# Patient Record
Sex: Male | Born: 1975 | Race: White | Hispanic: Yes | Marital: Married | Smoking: Never smoker
Health system: Southern US, Community
[De-identification: ages and names within clinical notes are randomized; demographics above are authoritative.]

## PROBLEM LIST (undated history)

## (undated) ENCOUNTER — Emergency Department (HOSPITAL_COMMUNITY): Admission: EM | Payer: Self-pay | Source: Home / Self Care

## (undated) DIAGNOSIS — K649 Unspecified hemorrhoids: Secondary | ICD-10-CM

## (undated) HISTORY — PX: NO PAST SURGERIES: SHX2092

---

## 2004-04-23 ENCOUNTER — Emergency Department (HOSPITAL_COMMUNITY): Admission: EM | Admit: 2004-04-23 | Discharge: 2004-04-23 | Payer: Self-pay | Admitting: Emergency Medicine

## 2012-07-18 ENCOUNTER — Emergency Department (HOSPITAL_COMMUNITY): Payer: Self-pay | Attending: Family Medicine

## 2012-07-18 ENCOUNTER — Encounter (HOSPITAL_COMMUNITY): Payer: Self-pay | Admitting: *Deleted

## 2012-07-18 ENCOUNTER — Ambulatory Visit (INDEPENDENT_AMBULATORY_CARE_PROVIDER_SITE_OTHER): Payer: 59 | Admitting: Family Medicine

## 2012-07-18 ENCOUNTER — Emergency Department (HOSPITAL_COMMUNITY): Payer: Self-pay

## 2012-07-18 VITALS — BP 121/71 | HR 77 | Temp 98.4°F | Resp 16 | Ht 67.5 in | Wt 168.0 lb

## 2012-07-18 DIAGNOSIS — R51 Headache: Secondary | ICD-10-CM

## 2012-07-18 DIAGNOSIS — Y9366 Activity, soccer: Secondary | ICD-10-CM | POA: Insufficient documentation

## 2012-07-18 DIAGNOSIS — W219XXA Striking against or struck by unspecified sports equipment, initial encounter: Secondary | ICD-10-CM | POA: Insufficient documentation

## 2012-07-18 DIAGNOSIS — R110 Nausea: Secondary | ICD-10-CM

## 2012-07-18 DIAGNOSIS — R42 Dizziness and giddiness: Secondary | ICD-10-CM

## 2012-07-18 DIAGNOSIS — S0990XA Unspecified injury of head, initial encounter: Secondary | ICD-10-CM | POA: Insufficient documentation

## 2012-07-18 DIAGNOSIS — S0191XA Laceration without foreign body of unspecified part of head, initial encounter: Secondary | ICD-10-CM

## 2012-07-18 DIAGNOSIS — S0190XA Unspecified open wound of unspecified part of head, initial encounter: Secondary | ICD-10-CM

## 2012-07-18 MED ORDER — CEPHALEXIN 500 MG PO CAPS: 500.0000 mg | ORAL_CAPSULE | Freq: Four times a day (QID) | ORAL | Status: AC

## 2012-07-18 NOTE — Patient Instructions (Addendum)
Go to [facility] er dept register there tell er registration you are here for out pt head ct.  WOUND CARE Please return in 7-10 days to have your stitches/staples removed or sooner if you have concerns. Marland Kitchen Keep area clean and dry for 24 hours. Do not remove bandage, if applied. . After 24 hours, remove bandage and wash wound gently with mild soap and warm water. Reapply a new bandage after cleaning wound, if directed. . Continue daily cleansing with soap and water until stitches/staples are removed. . Do not apply any ointments or creams to the wound while stitches/staples are in place, as this may cause delayed healing. . Notify the office if you experience any of the following signs of infection: Swelling, redness, pus drainage, streaking, fever >101.0 F . Notify the office if you experience excessive bleeding that does not stop after 15-20 minutes of constant, firm pressure.

## 2012-07-18 NOTE — ED Notes (Signed)
Sent to ED for Head CT. Pt was seen at [facility] for lac to head after injury while playing soccer. Pt states he was hit with a knee in the forehead. No loc. No nausea.

## 2012-07-18 NOTE — Progress Notes (Signed)
Verbal consent obtained from the patient.  Local anesthesia with 3cc Lidocaine 2% with epinephrine.  Wound scrubbed with soap and water and rinsed.  Wound closed with #4 6-0 vicryl internally and #5 5-0 Prolene HM and SI sutures.  Wound cleansed and dressed.

## 2012-07-18 NOTE — Progress Notes (Addendum)
[facility]:  Office Visit  Chief Complaint:  Chief Complaint  Patient presents with  . Head Injury    about 2:30p    HPI: John Richmond is a 36 y.o. male who complains of head injury 15-30  minutes ago while playing soccer. He was going for the soccer ball with his head and got kneed by another player to the right forehead, upon impact cause his forehead to be cut open and bleed. + sharp 3-4/10 right sided HA, + nausea, + dizziness that has been with him since injury. No LOC. Denies confusion or being disoriented. 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 Up to date on Tdap  History reviewed. No pertinent past medical history. History reviewed. No pertinent past surgical history. History   Social History  . Marital Status: Single    Spouse Name: N/A    Number of Children: N/A  . Years of Education: N/A   Social History Main Topics  . Smoking status: Never Smoker   . Smokeless tobacco: None  . Alcohol Use: Yes  . Drug Use: No  . Sexually Active: None   Other Topics Concern  . None   Social History Narrative  . None   Family History  Problem Relation Age of Onset  . Diabetes Mother    No Known Allergies Prior to Admission medications   Not on File     ROS: The patient denies fevers, chills, night sweats, unintentional weight loss, chest pain, palpitations, wheezing, dyspnea on exertion,  vomiting, abdominal pain, dysuria, hematuria, melena, numbness, weakness, or tingling.   All other systems have been reviewed and were otherwise negative with the exception of those mentioned in the HPI and as above.    PHYSICAL EXAM: Filed Vitals:   07/18/12 1449  BP: 121/71  Pulse: 77  Temp: 98.4 F (36.9 C)  Resp: 16   Filed Vitals:   07/18/12 1449  Height: 5' 7.5" (1.715 m)  Weight: 168 lb (76.204 kg)   Body mass index is 25.92 kg/(m^2).  General: Alert, no acute distress HEENT:  Normocephalic, atraumatic, oropharynx patent. EOMI, PERRLA, fundoscopic exam  nl Cardiovascular:  Regular rate and rhythm, no rubs murmurs or gallops.  No Carotid bruits, radial pulse intact. No pedal edema.  Respiratory: Clear to auscultation bilaterally.  No wheezes, rales, or rhonchi.  No cyanosis, no use of accessory musculature GI: No organomegaly, abdomen is soft and non-tender, positive bowel sounds.  No masses. Skin: + 4 cm laceration , open, clean, erythema and soft tissue swelling periwound Neurologic: Facial musculature symmetric. Speech appropriate. 5/5 UE and Kiwana Deblasi. NEg Romberg Psychiatric: Patient is appropriate throughout our interaction. Lymphatic: No cervical lymphadenopathy Musculoskeletal: Gait intact.   LABS: No results found for this or any previous visit.   EKG/XRAY:   Primary read interpreted by Dr. Conley Rolls at [facility].   ASSESSMENT/PLAN: Encounter Diagnoses  Name Primary?  . Laceration of head   . Headache   . Nausea   . Dizziness   . Head injury, acute Yes   Will send out for head CT today due to neuro sxs and large laceration with edema around periwound. No evidence of fracture underneath laceration on inspection and and palpation of wound bed but will need to confirm with CT scan Suture Wound care as directed No need for abx based on exam today, wound appears to be clean Fu as directed  Laketa Sandoz PHUONG, DO 07/18/2012 3:14 PM    CT result was called in and there was  a question of a foreign body on one of the CT slices. No acute bleed. Told patient that I will go ahead and give him abx just in case the foreign body is actually present. Rx Keflex. Advise to get repeat right supraorbital xray on next visit to see if foreign body is actually present .

## 2012-07-28 ENCOUNTER — Ambulatory Visit: Payer: 59

## 2012-07-28 ENCOUNTER — Ambulatory Visit (INDEPENDENT_AMBULATORY_CARE_PROVIDER_SITE_OTHER): Payer: 59 | Admitting: Family Medicine

## 2012-07-28 VITALS — BP 117/66 | HR 57 | Temp 98.6°F | Resp 16 | Ht 67.5 in | Wt 174.0 lb

## 2012-07-28 DIAGNOSIS — IMO0002 Reserved for concepts with insufficient information to code with codable children: Secondary | ICD-10-CM

## 2012-07-28 DIAGNOSIS — T148XXA Other injury of unspecified body region, initial encounter: Secondary | ICD-10-CM

## 2012-07-28 NOTE — Progress Notes (Signed)
[facility] [address], [place] [address] [phone]  Date:  07/28/2012   Name:  John Richmond   DOB:  1976-08-11   MRN:  [id number]  PCP:  No primary provider on file.    Chief Complaint: Suture / Staple Removal   History of Present Illness:  John Richmond is a 36 y.o. very pleasant male patient who presents with the following:  Needs to have SR today- he had a laceration to his right forehead on 07/18/12.  He was sent for a CT that day due to the nature of his injury- there was a possible FB in the soft tissue of his forehead.  We are to do an xray today to look for a FB.    There is no problem list on file for this patient.   No past medical history on file.  No past surgical history on file.  History  Substance Use Topics  . Smoking status: Never Smoker   . Smokeless tobacco: Not on file  . Alcohol Use: Yes    Family History  Problem Relation Age of Onset  . Diabetes Mother     No Known Allergies  Medication list has been reviewed and updated.  Current Outpatient Prescriptions on File Prior to Visit  Medication Sig Dispense Refill  . cephALEXin (KEFLEX) 500 MG capsule Take 1 capsule (500 mg total) by mouth 4 (four) times daily.  40 capsule  0    Review of Systems:  As per HPI- otherwise negative. He is feeling well and has no pain.    Physical Examination: Filed Vitals:   07/28/12 1510  BP: 117/66  Pulse: 57  Temp: 98.6 F (37 C)  Resp: 16   Filed Vitals:   07/28/12 1510  Height: 5' 7.5" (1.715 m)  Weight: 174 lb (78.926 kg)   Body mass index is 26.85 kg/(m^2). Ideal Body Weight: Weight in (lb) to have BMI = 25: 161.7    GEN: WDWN, NAD, Non-toxic, Alert & Oriented x 3 HEENT: Atraumatic, Normocephalic.   PEERL, oropharynx wnl Ears and Nose: No external deformity. EXTR: No clubbing/cyanosis/edema NEURO: Normal gait.  PSYCH: Normally interactive. Conversant. Not depressed or anxious appearing.  Calm demeanor.    Well- healed laceration on right forehead.  Removed sutures    [facility] reading (PRIMARY) by  Dr. Patsy Lager.  Facial bones looking for FB: negative   Assessment and Plan: 1. Laceration  DG Facial Bones Complete   Removed sutures today.  No evidence of radioopaque FB on plain film.  Discussed graded return to exercise   Abbe Amsterdam, MD

## 2013-02-17 ENCOUNTER — Ambulatory Visit
Admission: RE | Admit: 2013-02-17 | Discharge: 2013-02-17 | Disposition: A | Discharge status: Home / Self Care | Payer: Worker's Compensation | Source: Ambulatory Visit | Attending: Occupational Medicine | Admitting: Occupational Medicine

## 2013-02-17 ENCOUNTER — Other Ambulatory Visit: Payer: Self-pay | Admitting: Occupational Medicine

## 2013-02-17 DIAGNOSIS — S20211A Contusion of right front wall of thorax, initial encounter: Secondary | ICD-10-CM

## 2014-04-03 IMAGING — CT CT HEAD W/O CM
1 of 2 series · 13 of 30 positions shown, 17 images · non-contrast
Comparison: None.

CLINICAL DATA: Dizziness with headache.  Blunt injury to forehead
during soccer.

CT HEAD WITHOUT CONTRAST
TECHNIQUE: Contiguous axial images were obtained from the base of
the skull through the vertex without contrast

[Series 2: brain · axial · 0.47mm/px · z∈[+124,+263]mm · 13 of 32 slices shown, 17 images]
[im 3/32  brain]
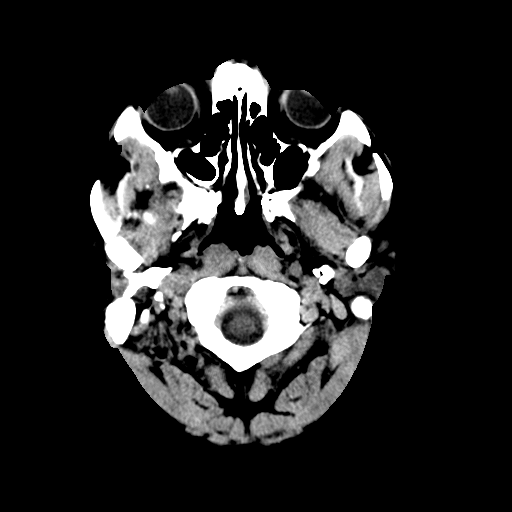
[im 3/32  bone]
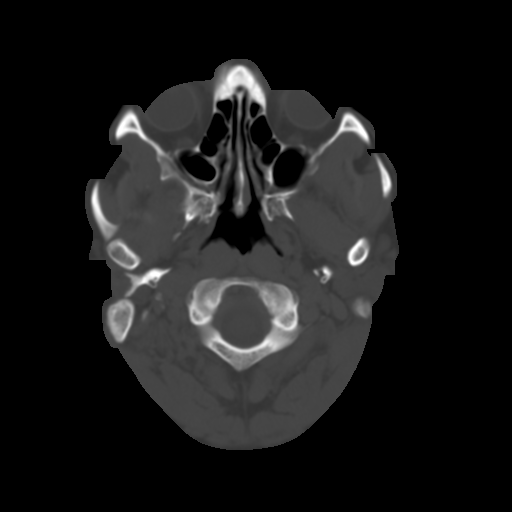
[im 5/32  brain]
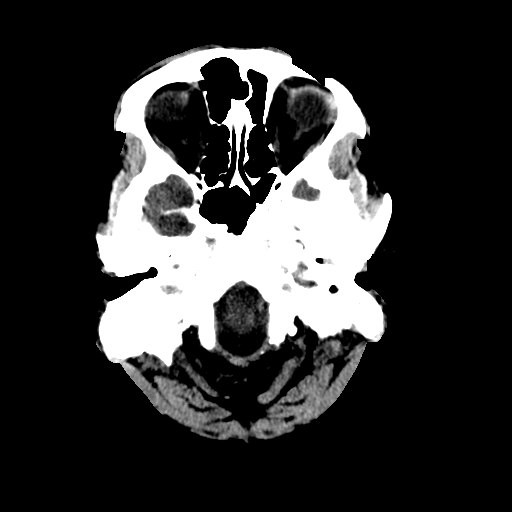
[im 7/32  brain]
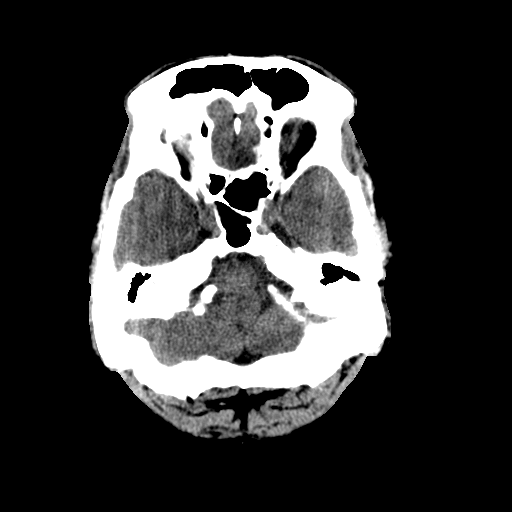
[im 9/32  brain]
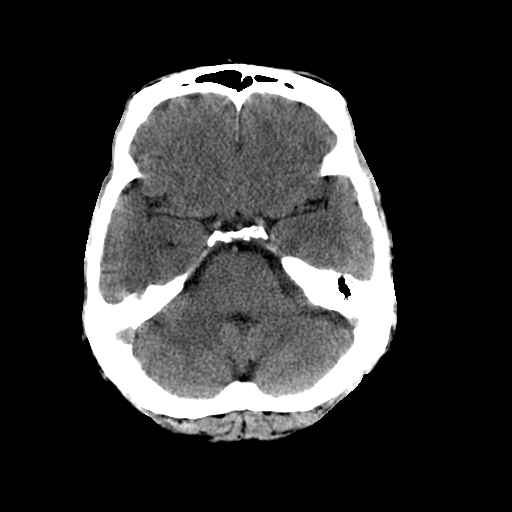
[im 12/32  brain]
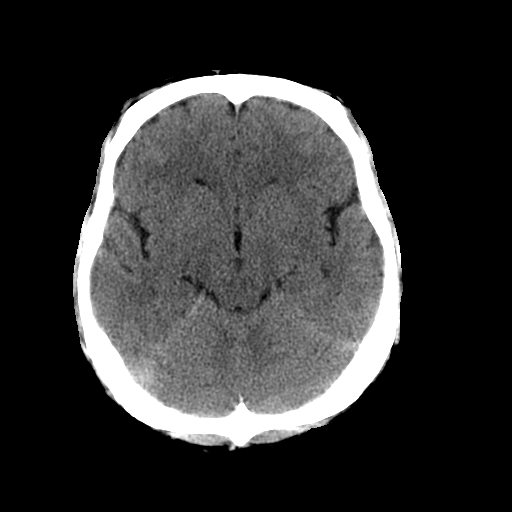
[im 12/32  bone]
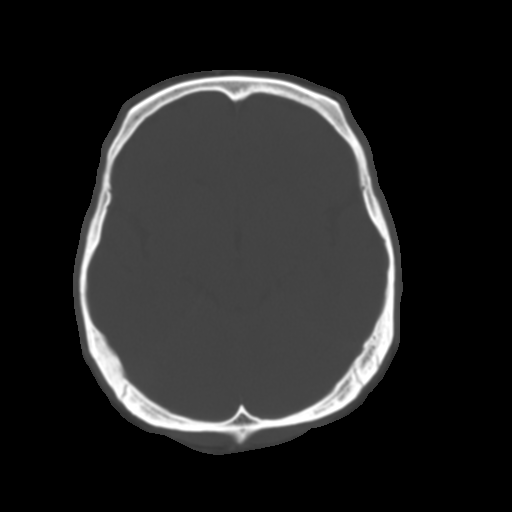
[im 14/32  brain]
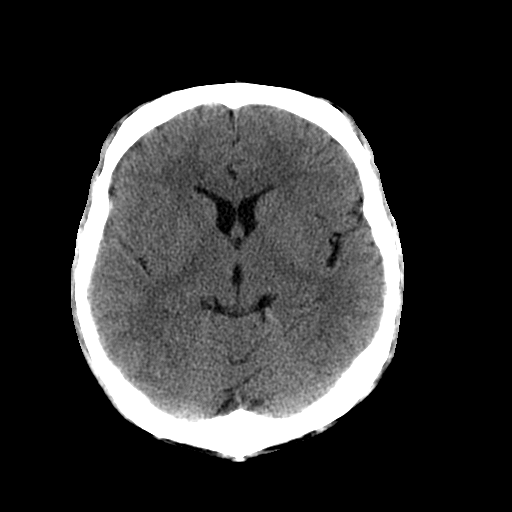
[im 16/32  brain]
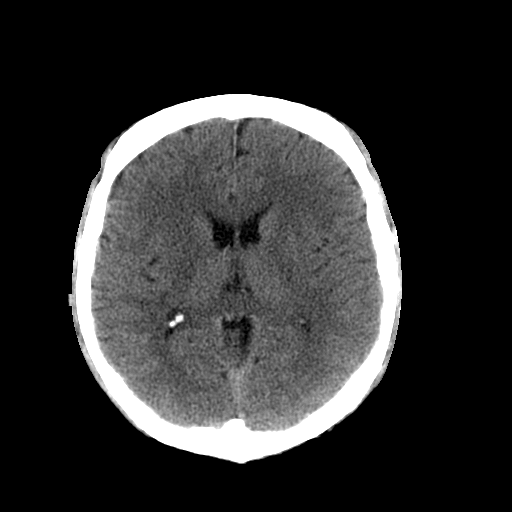
[im 18/32  brain]
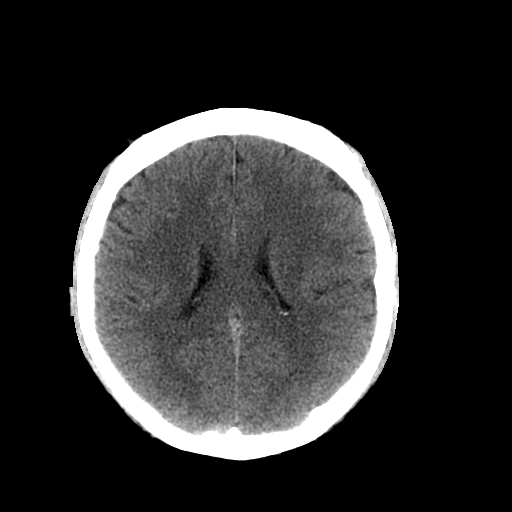
[im 20/32  brain]
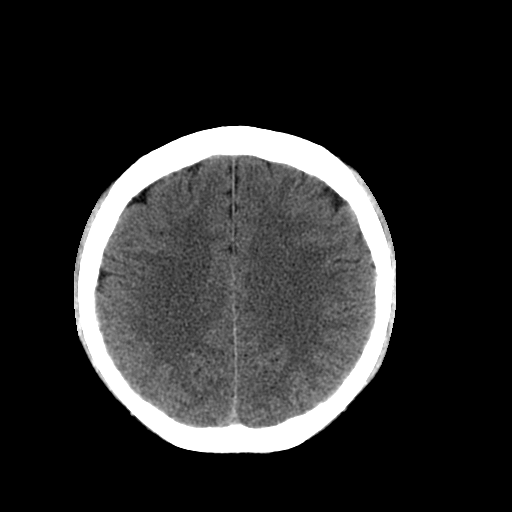
[im 20/32  bone]
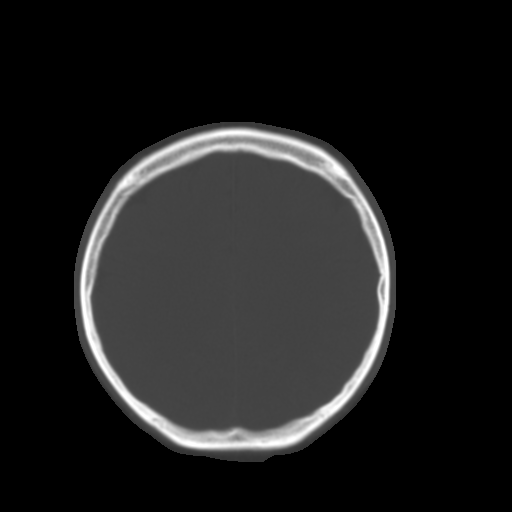
[im 23/32  brain]
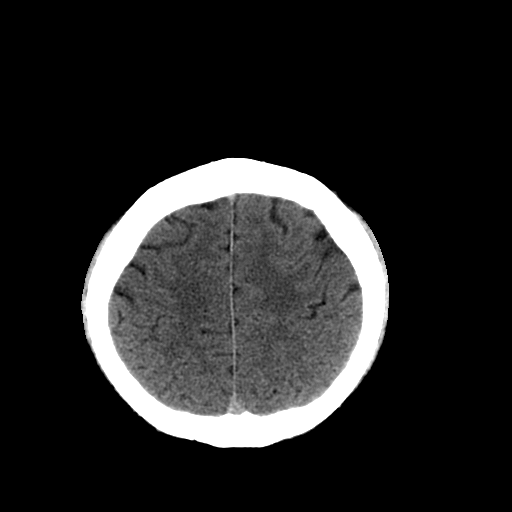
[im 25/32  brain]
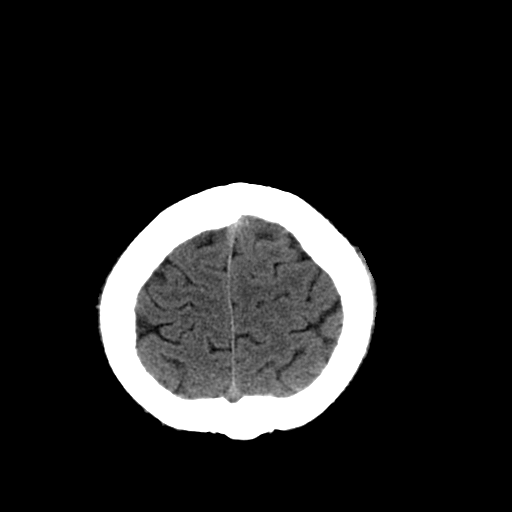
[im 27/32  brain]
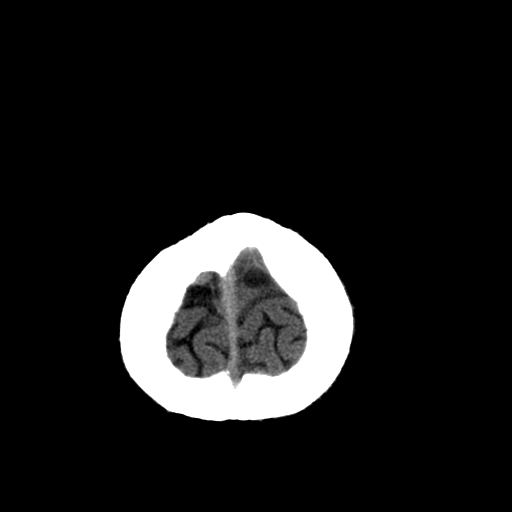
[im 29/32  brain]
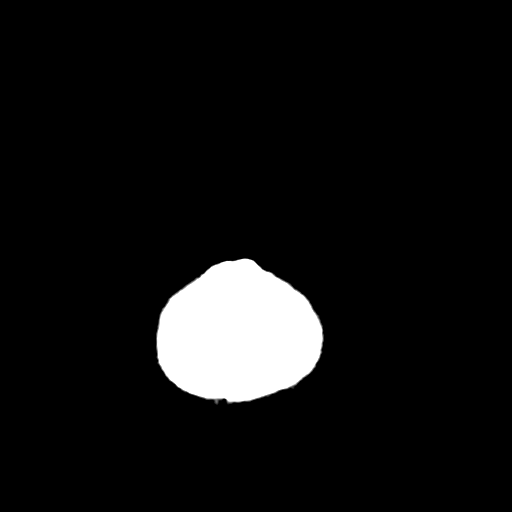
[im 29/32  bone]
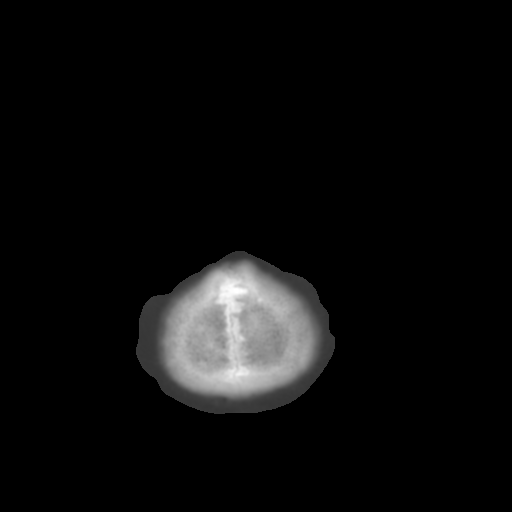

[13 of 30 positions shown; findings below may reference images not displayed]

FINDINGS: The brain has a normal appearance without evidence for
hemorrhage, acute infarction, hydrocephalus, or mass lesion.  There
is no extra axial fluid collection.  The skull and paranasal
sinuses are normal.

On image 9, there is a tiny radiopaque density (arrow)  overlying
the right frontal supraorbital region which could represent a small
foreign body.  Correlate clinically.
IMPRESSION: No acute or focal intracranial abnormality.
Question tiny right frontal scalp radiopaque foreign body.

## 2014-09-10 ENCOUNTER — Encounter (HOSPITAL_COMMUNITY): Payer: Self-pay | Admitting: Emergency Medicine

## 2014-09-10 ENCOUNTER — Emergency Department (HOSPITAL_COMMUNITY): Payer: 59

## 2014-09-10 ENCOUNTER — Emergency Department (HOSPITAL_COMMUNITY)
Admission: EM | Admit: 2014-09-10 | Discharge: 2014-09-10 | Disposition: A | Discharge status: Home / Self Care | Payer: 59 | Attending: Emergency Medicine | Admitting: Emergency Medicine

## 2014-09-10 DIAGNOSIS — R51 Headache: Secondary | ICD-10-CM | POA: Insufficient documentation

## 2014-09-10 DIAGNOSIS — R519 Headache, unspecified: Secondary | ICD-10-CM

## 2014-09-10 MED ORDER — OXYCODONE-ACETAMINOPHEN 5-325 MG PO TABS
1.0000 | ORAL_TABLET | Freq: Once | ORAL | Status: AC
  Administered 2014-09-10: 1 via ORAL
  Filled 2014-09-10: qty 1

## 2014-09-10 MED ORDER — HYDROCODONE-ACETAMINOPHEN 5-325 MG PO TABS: 1.0000 | ORAL_TABLET | ORAL | Status: DC | PRN

## 2014-09-10 MED ORDER — NAPROXEN 500 MG PO TABS: 500.0000 mg | ORAL_TABLET | Freq: Two times a day (BID) | ORAL | Status: DC

## 2014-09-10 MED ORDER — METOCLOPRAMIDE HCL 5 MG/ML IJ SOLN
10.0000 mg | Freq: Once | INTRAMUSCULAR | Status: AC
  Administered 2014-09-10: 10 mg via INTRAMUSCULAR
  Filled 2014-09-10: qty 2

## 2014-09-10 NOTE — Discharge Instructions (Signed)

## 2014-09-10 NOTE — ED Provider Notes (Signed)
CSN: [id number]     Arrival date & time 09/10/14  0256 History   First MD Initiated Contact with Patient 09/10/14 0413     Chief Complaint  Patient presents with  . Headache      HPI Patient presents to the emergency department complaining of headache over the past month.  He states it is more right-sided in nature.  Denies nausea vomiting.  No neck pain.  No weakness of his arms or legs.  No significant history of headaches before.  No weight loss.  No history of cancer.  Symptoms are mild to moderate in severity.  Nothing worsens or improves his pain.   History reviewed. No pertinent past medical history. History reviewed. No pertinent past surgical history. Family History  Problem Relation Age of Onset  . Diabetes Mother    History  Substance Use Topics  . Smoking status: Never Smoker   . Smokeless tobacco: Not on file  . Alcohol Use: Yes    Review of Systems  All other systems reviewed and are negative.     Allergies  Review of patient's allergies indicates no known allergies.  Home Medications   Prior to Admission medications   Medication Sig Start Date End Date Taking? Authorizing Provider  acetaminophen (TYLENOL) 500 MG tablet Take 1,000 mg by mouth every 6 (six) hours as needed for mild pain or headache.   Yes Historical Provider, MD  HYDROcodone-acetaminophen (NORCO/VICODIN) 5-325 MG per tablet Take 1 tablet by mouth every 4 (four) hours as needed for moderate pain. 09/10/14   Lyanne CoKevin M Delilah Mulgrew, MD  naproxen (NAPROSYN) 500 MG tablet Take 1 tablet (500 mg total) by mouth 2 (two) times daily. 09/10/14   Lyanne CoKevin M Shamera Yarberry, MD   BP 112/80  Pulse 59  Temp(Src) 98.2 F (36.8 C) (Oral)  Resp 16  SpO2 100% Physical Exam  Nursing note and vitals reviewed. Constitutional: He is oriented to person, place, and time. He appears well-developed and well-nourished.  HENT:  Head: Normocephalic and atraumatic.  Eyes: EOM are normal. Pupils are equal, round, and reactive to  light.  Neck: Normal range of motion.  Cardiovascular: Normal rate, regular rhythm, normal heart sounds and intact distal pulses.   Pulmonary/Chest: Effort normal and breath sounds normal. No respiratory distress.  Abdominal: Soft. He exhibits no distension. There is no tenderness.  Musculoskeletal: Normal range of motion.  Neurological: He is alert and oriented to person, place, and time.  5/5 strength in major muscle groups of  bilateral upper and lower extremities. Speech normal. No facial asymetry.   Skin: Skin is warm and dry.  Psychiatric: He has a normal mood and affect. Judgment normal.    ED Course  Procedures (including critical care time) Labs Review Labs Reviewed - No data to display  Imaging Review Ct Head Wo Contrast  09/10/2014   CLINICAL DATA:  Headache.  Initial encounter  EXAM: CT HEAD WITHOUT CONTRAST  TECHNIQUE: Contiguous axial images were obtained from the base of the skull through the vertex without intravenous contrast.  COMPARISON:  07/18/2012  FINDINGS: Skull and Sinuses:No acute fracture destructive process. Minimal scattered inflammatory mucosal thickening in the bilateral paranasal sinuses.  Orbits: Unremarkable.  Brain: No evidence of acute abnormality, such as acute infarction, hemorrhage, hydrocephalus, or mass lesion/mass effect.  IMPRESSION: Negative head CT.   Electronically Signed   By: Tiburcio PeaJonathan  Watts M.D.   On: 09/10/2014 05:11  I personally reviewed the imaging tests through PACS system I reviewed available ER/hospitalization records through  the EMR    EKG Interpretation None      MDM   Final diagnoses:  Headache, unspecified headache type    Normal neurologic exam.  Head CT demonstrates no mass.  Outpatient PCP and neurology followup.  May benefit from MRI as an outpatient.  We'll attempt to treat his headache with anti-inflammatories and short course of Vicodin.    Lyanne CoKevin M Linnae Rasool, MD 09/10/14 [phone]

## 2014-09-10 NOTE — ED Notes (Signed)
Patient transported to CT 

## 2014-09-10 NOTE — ED Notes (Addendum)
C/o HA, ongoing for ~ 1 month, pinpoints to R side, took tylenol 1000mg  at 0200, reports some improvement, also some intermitant nausea (resolved), not seen previously for this, rates 4/10. "Immunizations UTD." pt alert, NAD, calm, interactive, steady gait.

## 2014-11-03 IMAGING — CR DG RIBS W/ CHEST 3+V*R*
3 series · 3 of 3 positions shown · non-contrast
Comparison: None.

CLINICAL DATA: Post-traumatic pain in the upper anterior chest
wall. The patient states somewhat landed on his right posterior
chest wall while he was in the prone position.  A metallic BB was
utilized to mark the location of the patient's pain

RIGHT RIBS AND CHEST - 3+ VIEW

[w chest pa]
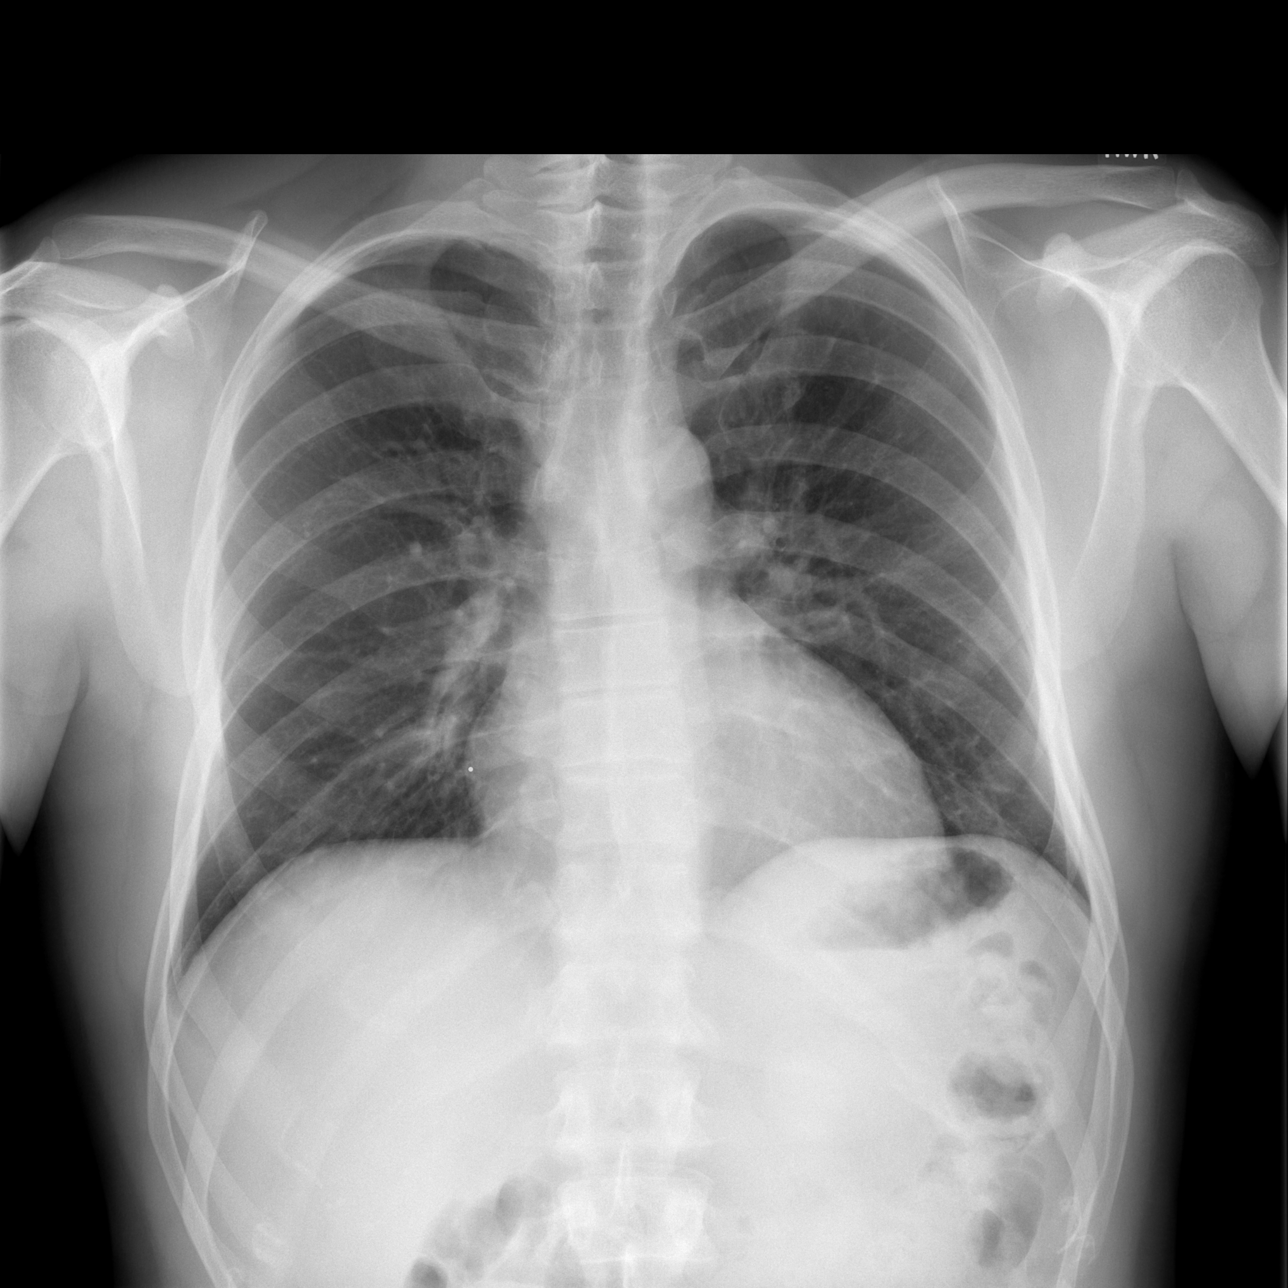

[w ribs ap/pa upper right *]
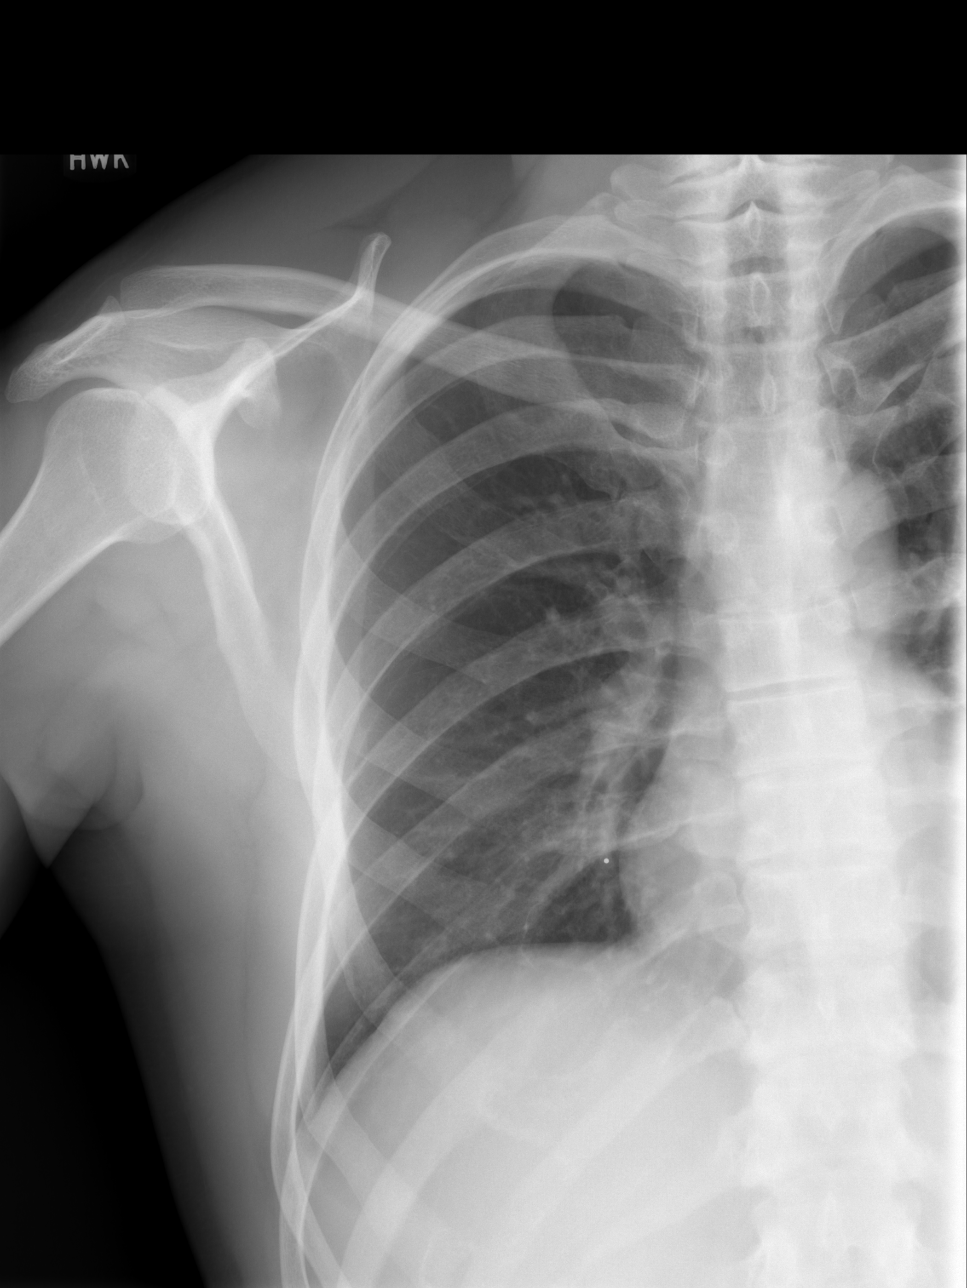

[w ribs oblique right *]
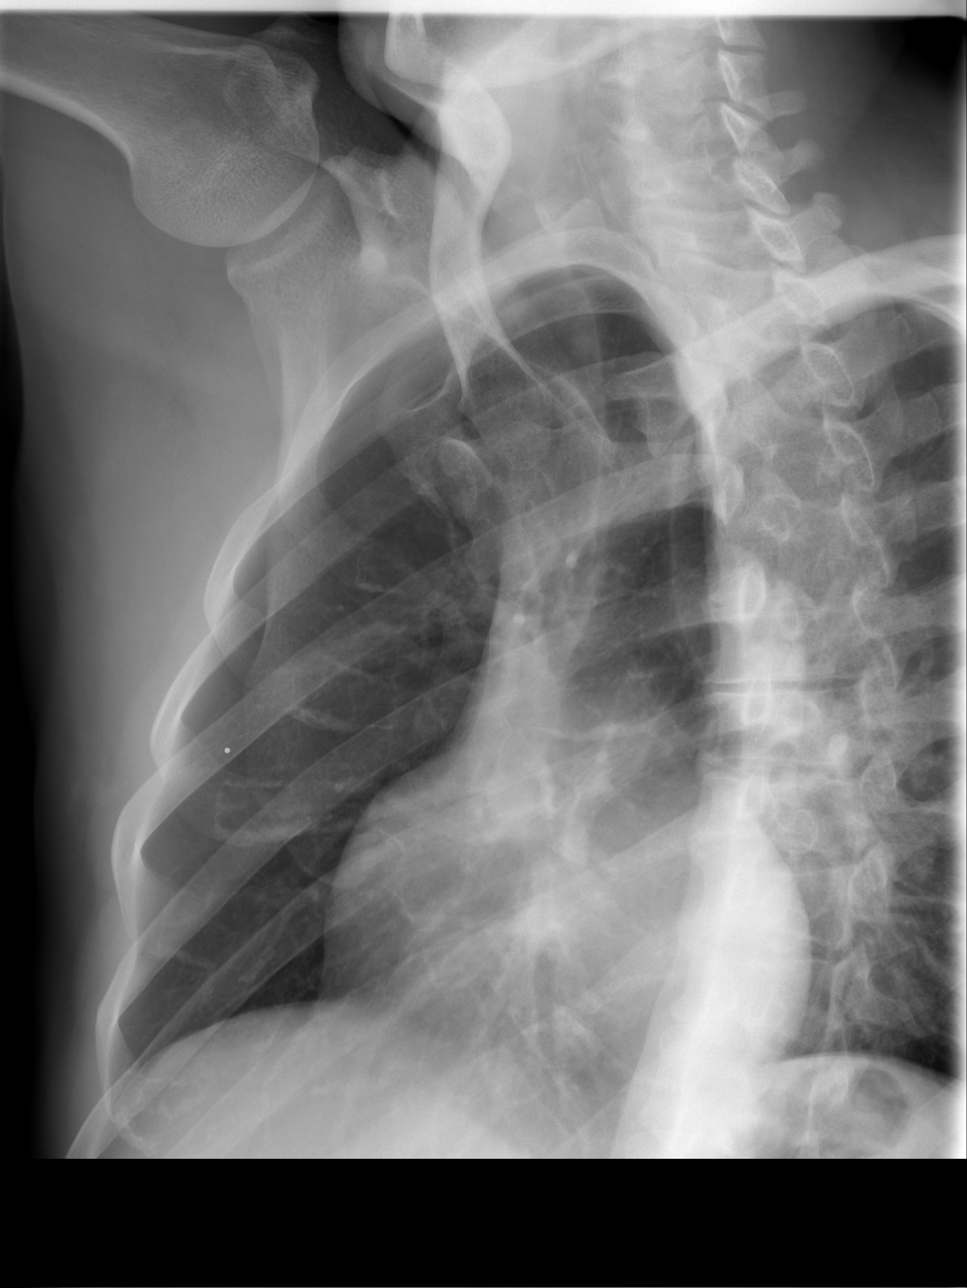

[3 of 3 positions shown; findings below may reference images not displayed]

FINDINGS: Heart and mediastinal contours are within normal limits.
The lung fields are clear with no signs of focal infiltrate or
congestive failure.  No pleural fluid or significant peribronchial
cuffing is seen. No pneumothorax is seen.

No evidence for a displaced or obvious nondisplaced rib fracture is
seen.  Remaining bony structures appear intact.
IMPRESSION: Normal chest with no definite bony abnormality seen.

## 2015-10-06 ENCOUNTER — Ambulatory Visit (INDEPENDENT_AMBULATORY_CARE_PROVIDER_SITE_OTHER): Payer: Commercial Managed Care - HMO | Admitting: Family Medicine

## 2015-10-06 VITALS — BP 110/60 | HR 95 | Temp 98.3°F | Resp 16 | Ht 67.5 in | Wt 174.0 lb

## 2015-10-06 DIAGNOSIS — K625 Hemorrhage of anus and rectum: Secondary | ICD-10-CM

## 2015-10-06 DIAGNOSIS — K648 Other hemorrhoids: Secondary | ICD-10-CM

## 2015-10-06 MED ORDER — HYDROCORTISONE ACE-PRAMOXINE 2.5-1 % RE CREA: 1.0000 "application " | TOPICAL_CREAM | Freq: Three times a day (TID) | RECTAL | Status: DC

## 2015-10-06 NOTE — Progress Notes (Signed)
Patient ID: John Richmond, male    DOB: [birth year]  Age: 39 y.o. MRN: [id number]  Chief Complaint  Patient presents with  . Rectal Bleeding    Noticed blood in stool x 2 weeks ago - then it happened again today. He states there was more today then the first time he noticed it.     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  Current allergies, medications, problem list, past/family and social histories reviewed.  Objective:  BP 110/60 mmHg  Pulse 95  Temp(Src) 98.3 F (36.8 C) (Oral)  Resp 16  Ht 5' 7.5" (1.715 m)  Wt 174 lb (78.926 kg)  BMI 26.83 kg/m2  SpO2 98%  Abdomen soft. Anus appears normal. No active bleeding though a little old blood on some tissue.digital exam has a palpable internal hemorrhoid  Procedure note: Diagnostic anoscopy was performed without difficulty.  A apparent internal hemorrhoid is  Present at about 2 o'clock.    Assessment & Plan:   Assessment: 1. Internal hemorrhoid, bleeding   2. Bright red blood per rectum       Plan: Per instructions.  Return if sx persist at all.    Meds ordered this encounter  Medications  . hydrocortisone-pramoxine (ANALPRAM HC) 2.5-1 % rectal cream    Sig: Place 1 application rectally 3 (three) times daily.    Dispense:  30 g    Refill:  0         Patient Instructions  Use the cream 2-3 times daily until you  Have not had any bleeding for about 5 days.  Return if further bleeding persists after next 2 weeks.  Return at any time if heavy bleeding or go to ER  Hemorrhoids Hemorrhoids are swollen veins around the rectum or anus. There are two types of hemorrhoids:   Internal hemorrhoids. These occur in the veins just inside the rectum. They may poke through to the outside and become irritated and painful.  External hemorrhoids. These occur in the veins outside the anus and can be felt as a painful swelling  or hard lump near the anus. CAUSES  Pregnancy.   Obesity.   Constipation or diarrhea.   Straining to have a bowel movement.   Sitting for long periods on the toilet.  Heavy lifting or other activity that caused you to strain.  Anal intercourse. SYMPTOMS   Pain.   Anal itching or irritation.   Rectal bleeding.   Fecal leakage.   Anal swelling.   One or more lumps around the anus.  DIAGNOSIS  Your caregiver may be able to diagnose hemorrhoids by visual examination. Other examinations or tests that may be performed include:   Examination of the rectal area with a gloved hand (digital rectal exam).   Examination of anal canal using a small tube (scope).   A blood test if you have lost a significant amount of blood.  A test to look inside the colon (sigmoidoscopy or colonoscopy). TREATMENT Most hemorrhoids can be treated at home. However, if symptoms do not seem to be getting better or if you have a lot of rectal bleeding, your caregiver may perform a procedure to help make the hemorrhoids get smaller or remove them completely. Possible treatments include:   Placing a rubber band at the base of the hemorrhoid to cut off the circulation (rubber band ligation).   Injecting a chemical to shrink the hemorrhoid (sclerotherapy).   Using a tool  to burn the hemorrhoid (infrared light therapy).   Surgically removing the hemorrhoid (hemorrhoidectomy).   Stapling the hemorrhoid to block blood flow to the tissue (hemorrhoid stapling).  HOME CARE INSTRUCTIONS   Eat foods with fiber, such as whole grains, beans, nuts, fruits, and vegetables. Ask your doctor about taking products with added fiber in them (fibersupplements).  Increase fluid intake. Drink enough water and fluids to keep your urine clear or pale yellow.   Exercise regularly.   Go to the bathroom when you have the urge to have a bowel movement. Do not wait.   Avoid straining to have bowel  movements.   Keep the anal area dry and clean. Use wet toilet paper or moist towelettes after a bowel movement.   Medicated creams and suppositories may be used or applied as directed.   Only take over-the-counter or prescription medicines as directed by your caregiver.   Take warm sitz baths for 15-20 minutes, 3-4 times a day to ease pain and discomfort.   Place ice packs on the hemorrhoids if they are tender and swollen. Using ice packs between sitz baths may be helpful.   Put ice in a plastic bag.   Place a towel between your skin and the bag.   Leave the ice on for 15-20 minutes, 3-4 times a day.   Do not use a donut-shaped pillow or sit on the toilet for long periods. This increases blood pooling and pain.  SEEK MEDICAL CARE IF:  You have increasing pain and swelling that is not controlled by treatment or medicine.  You have uncontrolled bleeding.  You have difficulty or you are unable to have a bowel movement.  You have pain or inflammation outside the area of the hemorrhoids. MAKE SURE YOU:  Understand these instructions.  Will watch your condition.  Will get help right away if you are not doing well or get worse.   This information is not intended to replace advice given to you by your health care provider. Make sure you discuss any questions you have with your health care provider.   Document Released: 11/07/2000 Document Revised: 10/27/2012 Document Reviewed: 09/14/2012 Elsevier Interactive Patient Education 2016 [employer].      No Follow-up on file.   Amedeo Detweiler, MD 10/06/2015

## 2015-10-06 NOTE — Patient Instructions (Addendum)
Use the cream 2-3 times daily until you  Have not had any bleeding for about 5 days.  Return if further bleeding persists after next 2 weeks.  Return at any time if heavy bleeding or go to ER  Hemorrhoids Hemorrhoids are swollen veins around the rectum or anus. There are two types of hemorrhoids:   Internal hemorrhoids. These occur in the veins just inside the rectum. They may poke through to the outside and become irritated and painful.  External hemorrhoids. These occur in the veins outside the anus and can be felt as a painful swelling or hard lump near the anus. CAUSES  Pregnancy.   Obesity.   Constipation or diarrhea.   Straining to have a bowel movement.   Sitting for long periods on the toilet.  Heavy lifting or other activity that caused you to strain.  Anal intercourse. SYMPTOMS   Pain.   Anal itching or irritation.   Rectal bleeding.   Fecal leakage.   Anal swelling.   One or more lumps around the anus.  DIAGNOSIS  Your caregiver may be able to diagnose hemorrhoids by visual examination. Other examinations or tests that may be performed include:   Examination of the rectal area with a gloved hand (digital rectal exam).   Examination of anal canal using a small tube (scope).   A blood test if you have lost a significant amount of blood.  A test to look inside the colon (sigmoidoscopy or colonoscopy). TREATMENT Most hemorrhoids can be treated at home. However, if symptoms do not seem to be getting better or if you have a lot of rectal bleeding, your caregiver may perform a procedure to help make the hemorrhoids get smaller or remove them completely. Possible treatments include:   Placing a rubber band at the base of the hemorrhoid to cut off the circulation (rubber band ligation).   Injecting a chemical to shrink the hemorrhoid (sclerotherapy).   Using a tool to burn the hemorrhoid (infrared light therapy).   Surgically removing the  hemorrhoid (hemorrhoidectomy).   Stapling the hemorrhoid to block blood flow to the tissue (hemorrhoid stapling).  HOME CARE INSTRUCTIONS   Eat foods with fiber, such as whole grains, beans, nuts, fruits, and vegetables. Ask your doctor about taking products with added fiber in them (fibersupplements).  Increase fluid intake. Drink enough water and fluids to keep your urine clear or pale yellow.   Exercise regularly.   Go to the bathroom when you have the urge to have a bowel movement. Do not wait.   Avoid straining to have bowel movements.   Keep the anal area dry and clean. Use wet toilet paper or moist towelettes after a bowel movement.   Medicated creams and suppositories may be used or applied as directed.   Only take over-the-counter or prescription medicines as directed by your caregiver.   Take warm sitz baths for 15-20 minutes, 3-4 times a day to ease pain and discomfort.   Place ice packs on the hemorrhoids if they are tender and swollen. Using ice packs between sitz baths may be helpful.   Put ice in a plastic bag.   Place a towel between your skin and the bag.   Leave the ice on for 15-20 minutes, 3-4 times a day.   Do not use a donut-shaped pillow or sit on the toilet for long periods. This increases blood pooling and pain.  SEEK MEDICAL CARE IF:  You have increasing pain and swelling that is not controlled  by treatment or medicine.  You have uncontrolled bleeding.  You have difficulty or you are unable to have a bowel movement.  You have pain or inflammation outside the area of the hemorrhoids. MAKE SURE YOU:  Understand these instructions.  Will watch your condition.  Will get help right away if you are not doing well or get worse.   This information is not intended to replace advice given to you by your health care provider. Make sure you discuss any questions you have with your health care provider.   Document Released: 11/07/2000  Document Revised: 10/27/2012 Document Reviewed: 09/14/2012 Elsevier Interactive Patient Education [employer]2016 [employer]vier Inc.

## 2020-12-25 DIAGNOSIS — U071 COVID-19: Secondary | ICD-10-CM

## 2020-12-25 HISTORY — DX: COVID-19: U07.1

## 2021-02-12 ENCOUNTER — Ambulatory Visit: Payer: Self-pay | Admitting: General Surgery

## 2021-02-12 NOTE — H&P (Signed)
The patient is a 45 year old male who presents with hemorrhoids. 45 year old male with approximately two-year history of anal discomfort. He was diagnosed with hemorrhoids by another physician and was given a cream for this. This did not give him any lasting relief. He was given a prescription for another cream but was unable to get this filled. He continues to have intermittent episodes of pain and discomfort as well as swelling and occasional blood on the toilet paper. He denies any blood in his stool. He reports regular bowel habits.   Past Surgical History Michel Bickers, LPN; 6/50/3546 56:81 AM) No pertinent past surgical history  Diagnostic Studies History Michel Bickers, LPN; 2/75/1700 17:49 AM) Colonoscopy never  Allergies Michel Bickers, LPN; 4/49/6759 16:38 AM) No Known Drug Allergies [02/12/2021]: Allergies Reconciled  Medication History Michel Bickers, LPN; 4/66/5993 57:01 AM) No Current Medications Medications Reconciled  Social History Michel Bickers, LPN; 7/79/3903 00:92 AM) Alcohol use Occasional alcohol use. Caffeine use Coffee. No drug use Tobacco use Never smoker.  Family History Michel Bickers, LPN; 02/20/761 26:33 AM) Diabetes Mellitus Father.  Other Problems Michel Bickers, LPN; 3/54/5625 63:89 AM) Hemorrhoids     Review of Systems Michel Bickers LPN; 3/73/4287 68:11 AM) General Not Present- Appetite Loss, Chills, Fatigue, Fever, Night Sweats, Weight Gain and Weight Loss. Skin Not Present- Change in Wart/Mole, Dryness, Hives, Jaundice, New Lesions, Non-Healing Wounds, Rash and Ulcer. HEENT Not Present- Earache, Hearing Loss, Hoarseness, Nose Bleed, Oral Ulcers, Ringing in the Ears, Seasonal Allergies, Sinus Pain, Sore Throat, Visual Disturbances, Wears glasses/contact lenses and Yellow Eyes. Respiratory Not Present- Bloody sputum, Chronic Cough, Difficulty Breathing, Snoring and Wheezing. Breast Not Present- Breast Mass, Breast Pain,  Nipple Discharge and Skin Changes. Cardiovascular Not Present- Chest Pain, Difficulty Breathing Lying Down, Leg Cramps, Palpitations, Rapid Heart Rate, Shortness of Breath and Swelling of Extremities. Gastrointestinal Not Present- Abdominal Pain, Bloating, Bloody Stool, Change in Bowel Habits, Chronic diarrhea, Constipation, Difficulty Swallowing, Excessive gas, Gets full quickly at meals, Hemorrhoids, Indigestion, Nausea, Rectal Pain and Vomiting. Male Genitourinary Not Present- Blood in Urine, Change in Urinary Stream, Frequency, Impotence, Nocturia, Painful Urination, Urgency and Urine Leakage. Musculoskeletal Present- Back Pain. Not Present- Joint Pain, Joint Stiffness, Muscle Pain, Muscle Weakness and Swelling of Extremities. Neurological Not Present- Decreased Memory, Fainting, Headaches, Numbness, Seizures, Tingling, Tremor, Trouble walking and Weakness. Psychiatric Not Present- Anxiety, Bipolar, Change in Sleep Pattern, Depression, Fearful and Frequent crying. Endocrine Not Present- Cold Intolerance, Excessive Hunger, Hair Changes, Heat Intolerance, Hot flashes and New Diabetes. Hematology Not Present- Blood Thinners, Easy Bruising, Excessive bleeding, Gland problems, HIV and Persistent Infections.  Vitals Tresa Endo Dockery LPN; 5/72/6203 55:97 AM) 02/12/2021 11:39 AM Weight: 180.4 lb Height: 68in Body Surface Area: 1.96 m Body Mass Index: 27.43 kg/m  Pulse: 67 (Regular)  BP: 120/78(Sitting, Left Arm, Standard)        Physical Exam Romie Levee MD; 02/12/2021 11:52 AM)  General Mental Status-Alert. General Appearance-Cooperative.  Rectal Anorectal Exam External - normal external exam. Internal - normal sphincter tone.   Results Romie Levee MD; 02/12/2021 11:53 AM) Procedures  Name Value Date ANOSCOPY, DIAGNOSTIC (41638) [ Hemorrhoids ] Procedure Other: Procedure: Anoscopy....Marland KitchenMarland KitchenSurgeon: Maisie Fus....Marland KitchenMarland KitchenAfter the risks and benefits were explained,  verbal consent was obtained for above procedure. A medical assistant chaperone was present thoroughout the entire procedure. ....Marland KitchenMarland KitchenAnesthesia: none....Marland KitchenMarland KitchenDiagnosis: Hemorrhoids....Marland KitchenMarland KitchenFindings: Grade 2 left lateral internal hemorrhoid, grade 2, right anterior internal hemorrhoid, grade 3 right posterior internal hemorrhoid, large  Performed: 02/12/2021 11:52 AM    Assessment & Plan Romie Levee  MD; 02/12/2021 11:52 AM)  PROLAPSED INTERNAL HEMORRHOIDS, GRADE 3 (L97.6) Impression: 45 year old male who presents to the office for evaluation of anal discomfort. He has been diagnosed with hemorrhoids in the past and is tried several different creams to control his symptoms. He continues to have difficulty. On exam today, he has a grade 3 right posterior internal hemorrhoid and large grade 2, right anterior and left lateral hemorrhoids. We discussed rubber band ligation here in the office versus transient hemorrhoidal dearterialization and the operating room. He decided to proceed with the operative procedure. We have discussed typical postoperative pain and recurrence rates. We have discussed postoperative bleeding as well. All questions were answered.

## 2021-04-15 ENCOUNTER — Other Ambulatory Visit (HOSPITAL_COMMUNITY): Payer: Self-pay

## 2021-04-29 ENCOUNTER — Other Ambulatory Visit (HOSPITAL_COMMUNITY): Payer: Self-pay

## 2021-05-21 ENCOUNTER — Encounter (HOSPITAL_BASED_OUTPATIENT_CLINIC_OR_DEPARTMENT_OTHER): Payer: Self-pay | Admitting: General Surgery

## 2021-05-21 ENCOUNTER — Other Ambulatory Visit: Payer: Self-pay

## 2021-05-21 NOTE — Progress Notes (Signed)
Spoke w/ via phone for pre-op interview---pt Lab needs dos---- none              Lab results------none COVID test -----patient states asymptomatic no test needed Arrive at -------730 am 05-23-2021 NPO after MN NO Solid Food.  Clear liquids from MN until---630 am then npo Med rec completed Medications to take morning of surgery -----none Diabetic medication -----n/a Patient instructed no nail polish to be worn day of surgery Patient instructed to bring photo id and insurance card day of surgery Patient aware to have Driver (ride ) / caregiver   wife Byrd Hesselbach or son Molly Maduro Patient verbalized understanding of instructions that were given at this phone interview. Patient denies shortness of breath, chest pain, fever, cough at this phone interview.

## 2021-05-22 ENCOUNTER — Encounter (HOSPITAL_BASED_OUTPATIENT_CLINIC_OR_DEPARTMENT_OTHER): Payer: Self-pay | Admitting: General Surgery

## 2021-05-22 NOTE — Anesthesia Preprocedure Evaluation (Addendum)
Anesthesia Evaluation  Patient identified by MRN, date of birth, ID band Patient awake    Reviewed: Allergy & Precautions, NPO status , Patient's Chart, lab work & pertinent test results  History of Anesthesia Complications Negative for: history of anesthetic complications  Airway Mallampati: I  TM Distance: >3 FB Neck ROM: Full    Dental no notable dental hx. (+) Dental Advisory Given   Pulmonary neg pulmonary ROS,    Pulmonary exam normal        Cardiovascular negative cardio ROS Normal cardiovascular exam     Neuro/Psych negative neurological ROS     GI/Hepatic Neg liver ROS,   Endo/Other  negative endocrine ROS  Renal/GU negative Renal ROS     Musculoskeletal negative musculoskeletal ROS (+)   Abdominal   Peds  Hematology negative hematology ROS (+)   Anesthesia Other Findings   Reproductive/Obstetrics                            Anesthesia Physical Anesthesia Plan  ASA: 1  Anesthesia Plan: MAC   Post-op Pain Management:    Induction:   PONV Risk Score and Plan: 1 and Ondansetron and Propofol infusion  Airway Management Planned: Natural Airway, Simple Face Mask and Nasal Cannula  Additional Equipment:   Intra-op Plan:   Post-operative Plan:   Informed Consent: I have reviewed the patients History and Physical, chart, labs and discussed the procedure including the risks, benefits and alternatives for the proposed anesthesia with the patient or authorized representative who has indicated his/her understanding and acceptance.     Dental advisory given  Plan Discussed with: Anesthesiologist and CRNA  Anesthesia Plan Comments:        Anesthesia Quick Evaluation

## 2021-05-23 ENCOUNTER — Encounter (HOSPITAL_BASED_OUTPATIENT_CLINIC_OR_DEPARTMENT_OTHER): Payer: Self-pay | Admitting: General Surgery

## 2021-05-23 ENCOUNTER — Ambulatory Visit (HOSPITAL_BASED_OUTPATIENT_CLINIC_OR_DEPARTMENT_OTHER): Payer: 59 | Admitting: Anesthesiology

## 2021-05-23 ENCOUNTER — Encounter (HOSPITAL_BASED_OUTPATIENT_CLINIC_OR_DEPARTMENT_OTHER)
Admission: RE | Disposition: A | Discharge status: Home / Self Care | Payer: Self-pay | Source: Home / Self Care | Attending: General Surgery

## 2021-05-23 ENCOUNTER — Ambulatory Visit (HOSPITAL_BASED_OUTPATIENT_CLINIC_OR_DEPARTMENT_OTHER)
Admission: RE | Admit: 2021-05-23 | Discharge: 2021-05-23 | Disposition: A | Discharge status: Home / Self Care | Payer: 59 | Attending: General Surgery | Admitting: General Surgery

## 2021-05-23 DIAGNOSIS — K642 Third degree hemorrhoids: Secondary | ICD-10-CM | POA: Insufficient documentation

## 2021-05-23 HISTORY — DX: Unspecified hemorrhoids: K64.9

## 2021-05-23 HISTORY — PX: TRANSANAL HEMORRHOIDAL DEARTERIALIZATION: SHX6136

## 2021-05-23 SURGERY — TRANSANAL HEMORRHOIDAL DEARTERIALIZATION
Anesthesia: Monitor Anesthesia Care | Site: Anus

## 2021-05-23 MED ORDER — KETAMINE HCL 50 MG/5ML IJ SOSY
PREFILLED_SYRINGE | INTRAMUSCULAR | Status: AC
  Filled 2021-05-23: qty 5

## 2021-05-23 MED ORDER — PROPOFOL 500 MG/50ML IV EMUL
INTRAVENOUS | Status: AC
  Filled 2021-05-23: qty 50

## 2021-05-23 MED ORDER — ONDANSETRON HCL 4 MG/2ML IJ SOLN
INTRAMUSCULAR | Status: DC | PRN
  Administered 2021-05-23: 4 mg via INTRAVENOUS

## 2021-05-23 MED ORDER — ACETAMINOPHEN 500 MG PO TABS
ORAL_TABLET | ORAL | Status: AC
  Filled 2021-05-23: qty 2

## 2021-05-23 MED ORDER — AMISULPRIDE (ANTIEMETIC) 5 MG/2ML IV SOLN: 10.0000 mg | Freq: Once | INTRAVENOUS | Status: DC | PRN

## 2021-05-23 MED ORDER — PROMETHAZINE HCL 25 MG/ML IJ SOLN: 6.2500 mg | INTRAMUSCULAR | Status: DC | PRN

## 2021-05-23 MED ORDER — ACETAMINOPHEN 325 MG RE SUPP: 650.0000 mg | RECTAL | Status: DC | PRN

## 2021-05-23 MED ORDER — OXYCODONE HCL 5 MG PO TABS
5.0000 mg | ORAL_TABLET | ORAL | Status: DC | PRN
  Administered 2021-05-23: 5 mg via ORAL

## 2021-05-23 MED ORDER — LACTATED RINGERS IV SOLN: INTRAVENOUS | Status: DC

## 2021-05-23 MED ORDER — OXYCODONE HCL 5 MG PO TABS: 5.0000 mg | ORAL_TABLET | Freq: Four times a day (QID) | ORAL | 0 refills | Status: DC | PRN

## 2021-05-23 MED ORDER — BUPIVACAINE LIPOSOME 1.3 % IJ SUSP
INTRAMUSCULAR | Status: DC | PRN
  Administered 2021-05-23: 20 mL

## 2021-05-23 MED ORDER — MIDAZOLAM HCL 2 MG/2ML IJ SOLN
INTRAMUSCULAR | Status: AC
  Filled 2021-05-23: qty 2

## 2021-05-23 MED ORDER — CELECOXIB 200 MG PO CAPS
ORAL_CAPSULE | ORAL | Status: AC
  Filled 2021-05-23: qty 1

## 2021-05-23 MED ORDER — CELECOXIB 200 MG PO CAPS
200.0000 mg | ORAL_CAPSULE | ORAL | Status: AC
  Administered 2021-05-23: 200 mg via ORAL

## 2021-05-23 MED ORDER — ONDANSETRON HCL 4 MG/2ML IJ SOLN
INTRAMUSCULAR | Status: AC
  Filled 2021-05-23: qty 2

## 2021-05-23 MED ORDER — BUPIVACAINE-EPINEPHRINE 0.5% -1:200000 IJ SOLN
INTRAMUSCULAR | Status: DC | PRN
  Administered 2021-05-23: 30 mL

## 2021-05-23 MED ORDER — MIDAZOLAM HCL 2 MG/2ML IJ SOLN
INTRAMUSCULAR | Status: DC | PRN
  Administered 2021-05-23: 2 mg via INTRAVENOUS

## 2021-05-23 MED ORDER — ACETAMINOPHEN 325 MG PO TABS: 650.0000 mg | ORAL_TABLET | ORAL | Status: DC | PRN

## 2021-05-23 MED ORDER — KETAMINE HCL 10 MG/ML IJ SOLN
INTRAMUSCULAR | Status: DC | PRN
  Administered 2021-05-23: 20 mg via INTRAVENOUS
  Administered 2021-05-23: 10 mg via INTRAVENOUS

## 2021-05-23 MED ORDER — SODIUM CHLORIDE 0.9 % IV SOLN: 250.0000 mL | INTRAVENOUS | Status: DC | PRN

## 2021-05-23 MED ORDER — LIDOCAINE HCL (CARDIAC) PF 100 MG/5ML IV SOSY
PREFILLED_SYRINGE | INTRAVENOUS | Status: DC | PRN
  Administered 2021-05-23: 40 mg via INTRAVENOUS
  Administered 2021-05-23: 60 mg via INTRAVENOUS

## 2021-05-23 MED ORDER — BUPIVACAINE LIPOSOME 1.3 % IJ SUSP: 20.0000 mL | Freq: Once | INTRAMUSCULAR | Status: DC

## 2021-05-23 MED ORDER — SODIUM CHLORIDE 0.9% FLUSH: 3.0000 mL | INTRAVENOUS | Status: DC | PRN

## 2021-05-23 MED ORDER — LIDOCAINE HCL (PF) 2 % IJ SOLN
INTRAMUSCULAR | Status: AC
  Filled 2021-05-23: qty 5

## 2021-05-23 MED ORDER — ACETAMINOPHEN 500 MG PO TABS
1000.0000 mg | ORAL_TABLET | ORAL | Status: AC
  Administered 2021-05-23: 1000 mg via ORAL

## 2021-05-23 MED ORDER — OXYCODONE HCL 5 MG PO TABS
ORAL_TABLET | ORAL | Status: AC
  Filled 2021-05-23: qty 1

## 2021-05-23 MED ORDER — FENTANYL CITRATE (PF) 100 MCG/2ML IJ SOLN
INTRAMUSCULAR | Status: AC
  Filled 2021-05-23: qty 2

## 2021-05-23 MED ORDER — DEXAMETHASONE SODIUM PHOSPHATE 10 MG/ML IJ SOLN
INTRAMUSCULAR | Status: AC
  Filled 2021-05-23: qty 1

## 2021-05-23 MED ORDER — GLYCOPYRROLATE 0.2 MG/ML IJ SOLN
INTRAMUSCULAR | Status: DC | PRN
  Administered 2021-05-23: .2 mg via INTRAVENOUS

## 2021-05-23 MED ORDER — PROPOFOL 10 MG/ML IV BOLUS
INTRAVENOUS | Status: DC | PRN
  Administered 2021-05-23: 30 mg via INTRAVENOUS
  Administered 2021-05-23 (×3): 20 mg via INTRAVENOUS

## 2021-05-23 MED ORDER — FENTANYL CITRATE (PF) 100 MCG/2ML IJ SOLN: 25.0000 ug | INTRAMUSCULAR | Status: DC | PRN

## 2021-05-23 MED ORDER — PROPOFOL 500 MG/50ML IV EMUL
INTRAVENOUS | Status: DC | PRN
  Administered 2021-05-23: 200 ug/kg/min via INTRAVENOUS

## 2021-05-23 MED ORDER — SODIUM CHLORIDE 0.9% FLUSH: 3.0000 mL | Freq: Two times a day (BID) | INTRAVENOUS | Status: DC

## 2021-05-23 SURGICAL SUPPLY — 36 items
BLADE HEX COATED 2.75 (ELECTRODE) IMPLANT
BRIEF STRETCH FOR OB PAD LRG (UNDERPADS AND DIAPERS) ×3 IMPLANT
COVER BACK TABLE 60X90IN (DRAPES) ×3 IMPLANT
DECANTER SPIKE VIAL GLASS SM (MISCELLANEOUS) IMPLANT
DRAPE HYSTEROSCOPY (MISCELLANEOUS) ×3 IMPLANT
DRAPE SHEET LG 3/4 BI-LAMINATE (DRAPES) ×3 IMPLANT
DRAPE UTILITY XL STRL (DRAPES) ×3 IMPLANT
DRSG PAD ABDOMINAL 8X10 ST (GAUZE/BANDAGES/DRESSINGS) ×3 IMPLANT
ELECT REM PT RETURN 9FT ADLT (ELECTROSURGICAL) ×3
ELECTRODE REM PT RTRN 9FT ADLT (ELECTROSURGICAL) ×1 IMPLANT
GAUZE 4X4 16PLY ~~LOC~~+RFID DBL (SPONGE) ×3 IMPLANT
GAUZE SPONGE 4X4 12PLY STRL (GAUZE/BANDAGES/DRESSINGS) IMPLANT
GAUZE SPONGE 4X4 8PLY NS (GAUZE/BANDAGES/DRESSINGS) ×3 IMPLANT
GLOVE SURG ENC MOIS LTX SZ6.5 (GLOVE) ×3 IMPLANT
GLOVE SURG UNDER POLY LF SZ7 (GLOVE) ×3 IMPLANT
GOWN STRL REUS W/TWL XL LVL3 (GOWN DISPOSABLE) ×3 IMPLANT
HEMOSTAT SURGICEL 4X8 (HEMOSTASIS) IMPLANT
KIT SIGMOIDOSCOPE (SET/KITS/TRAYS/PACK) IMPLANT
KIT SLIDE ONE PROLAPS HEMORR (KITS) ×3 IMPLANT
KIT TURNOVER CYSTO (KITS) ×3 IMPLANT
LEGGING LITHOTOMY PAIR STRL (DRAPES) ×3 IMPLANT
LUBRICANT JELLY K Y 4OZ (MISCELLANEOUS) ×3 IMPLANT
NEEDLE HYPO 22GX1.5 SAFETY (NEEDLE) ×3 IMPLANT
PACK BASIN DAY SURGERY FS (CUSTOM PROCEDURE TRAY) ×3 IMPLANT
PAD ARMBOARD 7.5X6 YLW CONV (MISCELLANEOUS) IMPLANT
PENCIL SMOKE EVACUATOR (MISCELLANEOUS) ×3 IMPLANT
SPONGE HEMORRHOID 8X3CM (HEMOSTASIS) IMPLANT
SUT CHROMIC 2 0 SH (SUTURE) IMPLANT
SUT CHROMIC 3 0 SH 27 (SUTURE) IMPLANT
SUT VIC AB 2-0 UR6 27 (SUTURE) IMPLANT
SYR CONTROL 10ML LL (SYRINGE) ×3 IMPLANT
TOWEL OR 17X26 10 PK STRL BLUE (TOWEL DISPOSABLE) ×3 IMPLANT
TRAY DSU PREP LF (CUSTOM PROCEDURE TRAY) ×3 IMPLANT
TUBE CONNECTING 12'X1/4 (SUCTIONS) ×1
TUBE CONNECTING 12X1/4 (SUCTIONS) ×2 IMPLANT
YANKAUER SUCT BULB TIP NO VENT (SUCTIONS) ×3 IMPLANT

## 2021-05-23 NOTE — Op Note (Addendum)
05/23/2021  10:42 AM  PATIENT:  John Richmond  45 y.o. male  Patient Care Team: Patient, No Pcp Per (Inactive) as PCP - General (General Practice)  PRE-OPERATIVE DIAGNOSIS:  GRADE 3 HEMORROIDS  POST-OPERATIVE DIAGNOSIS:  GRADE 3 HEMORROIDS  PROCEDURE:  TRANSANAL HEMORRHOIDAL DEARTERIALIZATION    Surgeon(s): Leighton Ruff, MD  ASSISTANT: Lucas Mallow, MD   ANESTHESIA:   local and MAC  EBL:  No intake/output data recorded.  DRAINS: none   SPECIMEN:  No Specimen  DISPOSITION OF SPECIMEN:  N/A  COUNTS:  YES  PLAN OF CARE: Discharge to home after PACU  PATIENT DISPOSITION:  PACU - hemodynamically stable.  INDICATION: 46 y.o. M with bleeding grade 3 hemorrhoids   OR FINDINGS: Grade 3 R posterior and L lateral, Grade 2 R anterior  Description: Informed consent was confirmed. Patient underwent general anesthesia without difficulty. Patient was placed into lithotomy positioning.  The perianal region was prepped and draped in sterile fashion. Surgical time out confirmed or plan.  I did digital rectal examination and then transitioned over to anoscopy to get a sense of the anatomy.  I switched over to the [facility] fiberoptically lit Doppler anocope.   Using the Doppler on the tip of the [place] anoscope, I identified the arterial hemorrhoidal vessels coming in in the classic hexagonal anatomical pattern (right posterior/lateral/anterior, left posterior /lateral/anterior).    I proceeded to ligate the hemorrhoidal arteries. I used a 2-0 Vicryl suture on a UR-6 needle in a figure-of-eight fashion over the signal around 6 cm proximal to the anal verge. I then ran that stitch longitudinally more distally to the dentate line. I then tied that stitch down to cause a hemorrhoidopexy. I did that for all 6 locations.    I redid Doppler anoscopy.  I did not note any further arterial signal. At completion of this, all hemorrhoids were reduced into the rectum.  There is no more prolapse. External  anatomy looked normal.  I repeated anoscopy and examination.   Hemostasis was good.  Patient is being extubated go to recovery room.  I am about to discuss the patient's status to the family.    I was personally present during the key and critical portions of this procedure and immediately available throughout the entire procedure, as documented in my operative note.

## 2021-05-23 NOTE — H&P (Signed)
The patient is a 45 year old male who presents with hemorrhoids. 45 year old male with approximately two-year history of anal discomfort.  He was diagnosed with hemorrhoids by another physician and was given a cream for this.  This did not give him any lasting relief.  He was given a prescription for another cream but was unable to get this filled.  He continues to have intermittent episodes of pain and discomfort as well as swelling and occasional blood on the toilet paper.  He denies any blood in his stool.  He reports regular bowel habits.   Past Surgical History  No pertinent past surgical history    Diagnostic Studies History Colonoscopy   never  Allergies  No Known Drug Allergies   [02/12/2021]: Allergies Reconciled    Medication History  No Current Medications  Medications Reconciled   Social History  Alcohol use   Occasional alcohol use. Caffeine use   Coffee. No drug use   Tobacco use   Never smoker.  Family History  Diabetes Mellitus   Father.  Other Problems  Hemorrhoids      Review of Systems  General Not Present- Appetite Loss, Chills, Fatigue, Fever, Night Sweats, Weight Gain and Weight Loss. Skin Not Present- Change in Wart/Mole, Dryness, Hives, Jaundice, New Lesions, Non-Healing Wounds, Rash and Ulcer. HEENT Not Present- Earache, Hearing Loss, Hoarseness, Nose Bleed, Oral Ulcers, Ringing in the Ears, Seasonal Allergies, Sinus Pain, Sore Throat, Visual Disturbances, Wears glasses/contact lenses and Yellow Eyes. Respiratory Not Present- Bloody sputum, Chronic Cough, Difficulty Breathing, Snoring and Wheezing. Breast Not Present- Breast Mass, Breast Pain, Nipple Discharge and Skin Changes. Cardiovascular Not Present- Chest Pain, Difficulty Breathing Lying Down, Leg Cramps, Palpitations, Rapid Heart Rate, Shortness of Breath and Swelling of Extremities. Gastrointestinal Not Present- Abdominal Pain, Bloating, Bloody Stool, Change in Bowel Habits, Chronic diarrhea,  Constipation, Difficulty Swallowing, Excessive gas, Gets full quickly at meals, Hemorrhoids, Indigestion, Nausea, Rectal Pain and Vomiting. Male Genitourinary Not Present- Blood in Urine, Change in Urinary Stream, Frequency, Impotence, Nocturia, Painful Urination, Urgency and Urine Leakage. Musculoskeletal Present- Back Pain. Not Present- Joint Pain, Joint Stiffness, Muscle Pain, Muscle Weakness and Swelling of Extremities. Neurological Not Present- Decreased Memory, Fainting, Headaches, Numbness, Seizures, Tingling, Tremor, Trouble walking and Weakness. Psychiatric Not Present- Anxiety, Bipolar, Change in Sleep Pattern, Depression, Fearful and Frequent crying. Endocrine Not Present- Cold Intolerance, Excessive Hunger, Hair Changes, Heat Intolerance, Hot flashes and New Diabetes. Hematology Not Present- Blood Thinners, Easy Bruising, Excessive bleeding, Gland problems, HIV and Persistent Infections.  BP 118/76   Pulse (!) 46   Temp 97.9 F (36.6 C) (Oral)   Resp 15   Ht 5\' 8"  (1.727 m)   Wt 81.6 kg   SpO2 100%   BMI 27.35 kg/m    Physical Exam  General Mental Status - Alert. General Appearance - Cooperative. CV: RRR Lungs: CTA Rectal Anorectal Exam External - normal external exam. Internal - normal sphincter tone.    Procedure   Other: Procedure: Anoscopy.... Marland KitchenSurgeon: Marland Kitchen....Maisie FusMarland KitchenAfter the risks and benefits were explained, verbal consent was obtained for above procedure.  A medical assistant chaperone was present thoroughout the entire procedure. ....Marland KitchenMarland KitchenAnesthesia: none....Marland KitchenMarland KitchenDiagnosis: Hemorrhoids....Marland KitchenMarland KitchenFindings: Grade 2 left lateral internal hemorrhoid, grade 2, right anterior internal hemorrhoid, grade 3 right posterior internal hemorrhoid, large  Performed: 02/12/2021 11:52 AM    Assessment & Plan  PROLAPSED INTERNAL HEMORRHOIDS, GRADE 3 02/14/2021) Impression: 45 year old male who presents to the office for evaluation of anal discomfort. He has been diagnosed with  hemorrhoids in the past  and is tried several different creams to control his symptoms. He continues to have difficulty. On exam, he has a grade 3 right posterior internal hemorrhoid and large grade 2, right anterior and left lateral hemorrhoids. We discussed rubber band ligation in the office versus transanal hemorrhoidal dearterialization in the operating room. He decided to proceed with the operative procedure. We have discussed typical postoperative pain, bleeding, urinary retention and recurrence rates. All questions were answered.

## 2021-05-23 NOTE — Discharge Instructions (Addendum)
ANORECTAL SURGERY: POST OP INSTRUCTIONS Take your usually prescribed home medications unless otherwise directed. DIET: During the first few hours after surgery sip on some liquids until you are able to urinate.  It is normal to not urinate for several hours after this surgery.  If you feel uncomfortable, please contact the office for instructions.  After you are able to urinate,you may eat, if you feel like it.  Follow a light bland diet the first 24 hours after arrival home, such as soup, liquids, crackers, etc.  Be sure to include lots of fluids daily (6-8 glasses).  Avoid fast food or heavy meals, as your are more likely to get nauseated.  Eat a low fat diet the next few days after surgery.  Limit caffeine intake to 1-2 servings a day. PAIN CONTROL: Pain is best controlled by a usual combination of several different methods TOGETHER: Muscle relaxation: Soak in a warm bath (or Sitz bath) three times a day and after bowel movements.  Continue to do this until all pain is resolved. Over the counter pain medication Prescription pain medication Most patients will experience some swelling and discomfort in the anus/rectal area and incisions.  Heat such as warm towels, sitz baths, warm baths, etc to help relax tight/sore spots and speed recovery.  Some people prefer to use ice, especially in the first couple days after surgery, as it may decrease the pain and swelling, or alternate between ice & heat.  Experiment to what works for you.  Swelling and bruising can take several weeks to resolve.  Pain can take even longer to completely resolve. It is helpful to take an over-the-counter pain medication regularly for the first few weeks.  Choose one of the following that works best for you: Naproxen (Aleve, etc)  Two 220mg tabs twice a day Ibuprofen (Advil, etc) Three 200mg tabs four times a day (every meal & bedtime) A  prescription for pain medication (such as percocet, oxycodone, hydrocodone, etc) should be  given to you upon discharge.  Take your pain medication as prescribed.  If you are having problems/concerns with the prescription medicine (does not control pain, nausea, vomiting, rash, itching, etc), please call us [phone] to see if we need to switch you to a different pain medicine that will work better for you and/or control your side effect better. If you need a refill on your pain medication, please contact your pharmacy.  They will contact our office to request authorization. Prescriptions will not be filled after 5 pm or on week-ends. KEEP YOUR BOWELS REGULAR and AVOID CONSTIPATION The goal is one to two soft bowel movements a day.  You should at least have a bowel movement every other day. Avoid getting constipated.  Between the surgery and the pain medications, it is common to experience some constipation. This can be very painful after rectal surgery.  Increasing fluid intake and taking a fiber supplement (such as Metamucil, Citrucel, FiberCon, etc) 1-2 times a day regularly will usually help prevent this problem from occurring.  A stool softener like colace is also recommended.  This can be purchased over the counter at your pharmacy.  You can take it up to 3 times a day.  If you do not have a bowel movement after 24 hrs since your surgery, take one does of milk of magnesia.  If you still haven't had a bowel movement 8-12 hours after that dose, take another dose.  If you don't have a bowel movement 48 hrs after surgery,   purchase a Fleets enema from the drug store and administer gently per package instructions.  If you still are having trouble with your bowel movements after that, please call the office for further instructions. If you develop diarrhea or have many loose bowel movements, simplify your diet to bland foods & liquids for a few days.  Stop any stool softeners and decrease your fiber supplement.  Switching to mild anti-diarrheal medications (Kayopectate, Pepto Bismol) can help.   If this worsens or does not improve, please call us.  Wound Care Remove your bandages before your first bowel movement or 8 hours after surgery.     Remove any wound packing material at this tim,e as well.  You do not need to repack the wound unless instructed otherwise.  Wear an absorbent pad or soft cotton gauze in your underwear to catch any drainage and help keep the area clean. You should change this every 2-3 hours while awake. Keep the area clean and dry.  Bathe / shower every day, especially after bowel movements.  Keep the area clean by showering / bathing over the incision / wound.   It is okay to soak an open wound to help wash it.  Wet wipes or showers / gentle washing after bowel movements is often less traumatic than regular toilet paper. You may have some styrofoam-like soft packing in the rectum which will come out with the first bowel movement.  You will often notice bleeding with bowel movements.  This should slow down by the end of the first week of surgery Expect some drainage.  This should slow down, too, by the end of the first week of surgery.  Wear an absorbent pad or soft cotton gauze in your underwear until the drainage stops. Do Not sit on a rubber or pillow ring.  This can make you symptoms worse.  You may sit on a soft pillow if needed.  ACTIVITIES as tolerated:   You may resume regular (light) daily activities beginning the next day--such as daily self-care, walking, climbing stairs--gradually increasing activities as tolerated.  If you can walk 30 minutes without difficulty, it is safe to try more intense activity such as jogging, treadmill, bicycling, low-impact aerobics, swimming, etc. Save the most intensive and strenuous activity for last such as sit-ups, heavy lifting, contact sports, etc  Refrain from any heavy lifting or straining until you are off narcotics for pain control.   You may drive when you are no longer taking prescription pain medication, you can  comfortably sit for long periods of time, and you can safely maneuver your car and apply brakes. You may have sexual intercourse when it is comfortable.  FOLLOW UP in our office Please call [facility] at [phone] to set up an appointment to see your surgeon in the office for a follow-up appointment approximately 3-4 weeks after your surgery. Make sure that you call for this appointment the day you arrive home to insure a convenient appointment time. 10. IF YOU HAVE DISABILITY OR FAMILY LEAVE FORMS, BRING THEM TO THE OFFICE FOR PROCESSING.  DO NOT GIVE THEM TO YOUR DOCTOR.     WHEN TO CALL us [phone]: Poor pain control Reactions / problems with new medications (rash/itching, nausea, etc)  Fever over 101.5 F (38.5 C) Inability to urinate Nausea and/or vomiting Worsening swelling or bruising Continued bleeding from incision. Increased pain, redness, or drainage from the incision  The clinic staff is available to answer your questions during regular business hours (8:30am-5pm).  Please don't hesitate to call and ask to speak to one of our nurses for clinical concerns.   A surgeon from [facility] is always on call at the hospitals   If you have a medical emergency, go to the nearest emergency room or call 911.    [facility] [address], [place], [place]  [address] ? MAIN: [phone] ? TOLL FREE: [phone] ? FAX [phone] [email or web address]   Post Anesthesia Home Care Instructions  Activity: Get plenty of rest for the remainder of the day. A responsible individual must stay with you for 24 hours following the procedure.  For the next 24 hours, DO NOT: -Drive a car -Advertising copywriter -Drink alcoholic beverages -Take any medication unless instructed by your physician -Make any legal decisions or sign important papers.  Meals: Start with liquid foods such as gelatin or soup. Progress to regular foods  as tolerated. Avoid greasy, spicy, heavy foods. If nausea and/or vomiting occur, drink only clear liquids until the nausea and/or vomiting subsides. Call your physician if vomiting continues.  Special Instructions/Symptoms: Your throat may feel dry or sore from the anesthesia or the breathing tube placed in your throat during surgery. If this causes discomfort, gargle with warm salt water. The discomfort should disappear within 24 hours.  Information for Discharge Teaching: EXPAREL (bupivacaine liposome injectable suspension)   Your surgeon or anesthesiologist gave you EXPAREL(bupivacaine) to help control your pain after surgery.  EXPAREL is a local anesthetic that provides pain relief by numbing the tissue around the surgical site. EXPAREL is designed to release pain medication over time and can control pain for up to 72 hours. Depending on how you respond to EXPAREL, you may require less pain medication during your recovery.  Possible side effects: Temporary loss of sensation or ability to move in the area where bupivacaine was injected. Nausea, vomiting, constipation Rarely, numbness and tingling in your mouth or lips, lightheadedness, or anxiety may occur. Call your doctor right away if you think you may be experiencing any of these sensations, or if you have other questions regarding possible side effects.  Follow all other discharge instructions given to you by your surgeon or nurse. Eat a healthy diet and drink plenty of water or other fluids.  If you return to the hospital for any reason within 96 hours following the administration of EXPAREL, it is important for health care providers to know that you have received this anesthetic. A teal colored band has been placed on your arm with the date, time and amount of EXPAREL you have received in order to alert and inform your health care providers. Please leave this armband in place for the full 96 hours following administration, and then you  may remove the band.       Do not remove green armband before Monday, May 27, 2021.  Take Tylenol starting at 2 PM as needed for pain. Take Ibuprofen starting at 4 PM as needed form pain.

## 2021-05-23 NOTE — Anesthesia Procedure Notes (Signed)
Procedure Name: MAC Date/Time: 05/23/2021 9:40 AM Performed by: Mechele Claude, CRNA Pre-anesthesia Checklist: Patient identified, Emergency Drugs available, Suction available and Patient being monitored Oxygen Delivery Method: Nasal cannula Placement Confirmation: positive ETCO2 and breath sounds checked- equal and bilateral

## 2021-05-23 NOTE — Anesthesia Postprocedure Evaluation (Signed)
Anesthesia Post Note  Patient: John Richmond  Procedure(s) Performed: TRANSANAL HEMORRHOIDAL DEARTERIALIZATION (Anus)     Patient location during evaluation: PACU Anesthesia Type: MAC Level of consciousness: awake and alert Pain management: pain level controlled Vital Signs Assessment: post-procedure vital signs reviewed and stable Respiratory status: spontaneous breathing and respiratory function stable Cardiovascular status: stable Postop Assessment: no apparent nausea or vomiting Anesthetic complications: no   No notable events documented.  Last Vitals:  Vitals:   05/23/21 1130 05/23/21 1145  BP: 123/83 127/88  Pulse: 61 73  Resp: 13 16  Temp: 36.4 C (!) 36.3 C  SpO2: 100% 100%    Last Pain:  Vitals:   05/23/21 1145  TempSrc:   PainSc: 3                  [name]

## 2021-05-23 NOTE — Transfer of Care (Signed)
Immediate Anesthesia Transfer of Care Note  Patient: John Richmond  Procedure(s) Performed: TRANSANAL HEMORRHOIDAL DEARTERIALIZATION (Anus)  Patient Location: PACU  Anesthesia Type:MAC  Level of Consciousness: awake, alert , oriented and patient cooperative  Airway & Oxygen Therapy: Patient Spontanous Breathing and Patient connected to nasal cannula oxygen  Post-op Assessment: Report given to RN and Post -op Vital signs reviewed and stable  Post vital signs: Reviewed and stable  Last Vitals:  Vitals Value Taken Time  BP    Temp    Pulse 77 05/23/21 1056  Resp 16 05/23/21 1056  SpO2 100 % 05/23/21 1056  Vitals shown include unvalidated device data.  Last Pain:  Vitals:   05/23/21 0757  TempSrc: Oral  PainSc: 0-No pain      Patients Stated Pain Goal: 5 (34/03/52 4818)  Complications: No notable events documented.

## 2021-05-24 ENCOUNTER — Encounter (HOSPITAL_BASED_OUTPATIENT_CLINIC_OR_DEPARTMENT_OTHER): Payer: Self-pay | Admitting: General Surgery

## 2024-10-31 ENCOUNTER — Other Ambulatory Visit: Payer: Self-pay

## 2024-10-31 ENCOUNTER — Ambulatory Visit
Admission: EM | Admit: 2024-10-31 | Discharge: 2024-10-31 | Disposition: A | Discharge status: Home / Self Care | Attending: Family Medicine | Admitting: Family Medicine

## 2024-10-31 DIAGNOSIS — S39012A Strain of muscle, fascia and tendon of lower back, initial encounter: Secondary | ICD-10-CM

## 2024-10-31 MED ORDER — METHOCARBAMOL 750 MG PO TABS: 750.0000 mg | ORAL_TABLET | Freq: Two times a day (BID) | ORAL | 0 refills | Status: AC | PRN

## 2024-10-31 MED ORDER — KETOROLAC TROMETHAMINE 30 MG/ML IJ SOLN
30.0000 mg | Freq: Once | INTRAMUSCULAR | Status: AC
  Administered 2024-10-31: 30 mg via INTRAMUSCULAR

## 2024-10-31 NOTE — ED Triage Notes (Signed)
 Pt c/o lower back pain started yesterday. Pt states he was playing soccer and running when the back pain began. Pt denies numbness or tingling. Pt denies loss or bowel or bladder.

## 2024-10-31 NOTE — ED Provider Notes (Signed)
 [facility]    CSN: [id number] Arrival date & time: 10/31/24  1537      History   Chief Complaint No chief complaint on file.   HPI John Richmond is a 48 y.o. male presents for back pain.  Patient reports yesterday he was playing soccer and while running he noticed some pain in his lower back that has been intermittent since that time.  Pain does not radiate.  He denies any numbness/tingling/weakness of his lower extremities, no bowel or bladder incontinence, no saddle paresthesia.  No injury such as fall.  No history of back surgeries or fractures in the past.  He took Tylenol  for symptoms with minimal improvement.  No other concerns at this time  HPI  Past Medical History:  Diagnosis Date   COVID 12/25/2020   asymptomatic   Hemorrhoids     There are no active problems to display for this patient.   Past Surgical History:  Procedure Laterality Date   NO PAST SURGERIES     TRANSANAL HEMORRHOIDAL DEARTERIALIZATION N/A 05/23/2021   Procedure: TRANSANAL HEMORRHOIDAL DEARTERIALIZATION;  Surgeon: Debby Hila, MD;  Location: [facility];  Service: General;  Laterality: N/A;       Home Medications    Prior to Admission medications   Medication Sig Start Date End Date Taking? Authorizing Provider  methocarbamol  (ROBAXIN ) 750 MG tablet Take 1 tablet (750 mg total) by mouth 2 (two) times daily as needed for muscle spasms. 10/31/24  Yes Loreda Myla SAUNDERS, NP    Family History Family History  Problem Relation Age of Onset   Diabetes Mother     Social History Social History   Tobacco Use   Smoking status: Never   Smokeless tobacco: Never  Vaping Use   Vaping status: Never Used  Substance Use Topics   Alcohol use: Yes    Comment: beer occ   Drug use: No     Allergies   Patient has no known allergies.   Review of Systems Review of Systems  Musculoskeletal:  Positive for back pain.     Physical Exam Triage Vital Signs ED Triage  Vitals  Encounter Vitals Group     BP 10/31/24 1600 121/73     Girls Systolic BP Percentile --      Girls Diastolic BP Percentile --      Boys Systolic BP Percentile --      Boys Diastolic BP Percentile --      Pulse Rate 10/31/24 1600 (!) 58     Resp 10/31/24 1600 17     Temp 10/31/24 1600 98.4 F (36.9 C)     Temp Source 10/31/24 1600 Oral     SpO2 10/31/24 1600 96 %     Weight --      Height --      Head Circumference --      Peak Flow --      Pain Score 10/31/24 1558 7     Pain Loc --      Pain Education --      Exclude from Growth Chart --    No data found.  Updated Vital Signs BP 121/73   Pulse (!) 58   Temp 98.4 F (36.9 C) (Oral)   Resp 17   SpO2 96%   Visual Acuity Right Eye Distance:   Left Eye Distance:   Bilateral Distance:    Right Eye Near:   Left Eye Near:    Bilateral Near:  Physical Exam Vitals and nursing note reviewed.  Constitutional:      General: He is not in acute distress.    Appearance: Normal appearance. He is not ill-appearing.  HENT:     Head: Normocephalic and atraumatic.  Eyes:     Pupils: Pupils are equal, round, and reactive to light.  Cardiovascular:     Rate and Rhythm: Normal rate.  Pulmonary:     Effort: Pulmonary effort is normal.  Musculoskeletal:     Thoracic back: Normal.     Lumbar back: Tenderness present. No swelling, edema, deformity, signs of trauma, lacerations, spasms or bony tenderness. Normal range of motion. Negative right straight leg raise test and negative left straight leg raise test. No scoliosis.       Back:     Comments: Strength 5 out of 5 bilateral lower extremity  Skin:    General: Skin is warm and dry.  Neurological:     General: No focal deficit present.     Mental Status: He is alert and oriented to person, place, and time.  Psychiatric:        Mood and Affect: Mood normal.        Behavior: Behavior normal.      UC Treatments / Results  Labs (all labs ordered are listed, but  only abnormal results are displayed) Labs Reviewed - No data to display  EKG   Radiology No results found.  Procedures Procedures (including critical care time)  Medications Ordered in UC Medications  ketorolac  (TORADOL ) 30 MG/ML injection 30 mg (has no administration in time range)    Initial Impression / Assessment and Plan / UC Course  I have reviewed the triage vital signs and the nursing notes.  Pertinent labs & imaging results that were available during my care of the patient were reviewed by me and considered in my medical decision making (see chart for details).     Reviewed exam and symptoms with patient.  No red flags.  Discussed low back strain.  Patient given Toradol  injection in clinic.  Monitored for 10 minutes after injection with no reaction noted and tolerated well.  Instructed no NSAIDs for 24 hours and verbalized understanding.  Will do trial of Robaxin  twice daily as needed.  Advised heat rest and PCP follow-up if symptoms do not improve.  ER precautions reviewed Final Clinical Impressions(s) / UC Diagnoses   Final diagnoses:  Strain of lumbar region, initial encounter     Discharge Instructions      You were given a Toradol  injection in clinic today. Do not take any over the counter NSAID's such as Advil, ibuprofen, Aleve , or naproxen  for 24 hours.  You may take tylenol  if needed You may take Robaxin  twice daily as needed.  Please note this medication will make you drowsy.  Do not drink alcohol or drive on this medication.  Heat to the low back and rest.  Please follow-up with your PCP if your symptoms do not improve.  Please go to the ER for any worsening symptoms.  Hope you feel better soon!      ED Prescriptions     Medication Sig Dispense Auth. Provider   methocarbamol  (ROBAXIN ) 750 MG tablet Take 1 tablet (750 mg total) by mouth 2 (two) times daily as needed for muscle spasms. 10 tablet Jhonnie Aliano, Jodi R, NP      PDMP not reviewed this  encounter.   Loreda Myla SAUNDERS, NP 10/31/24 1630

## 2024-10-31 NOTE — Discharge Instructions (Signed)
 You were given a Toradol  injection in clinic today. Do not take any over the counter NSAID's such as Advil, ibuprofen, Aleve , or naproxen  for 24 hours.  You may take tylenol  if needed You may take Robaxin  twice daily as needed.  Please note this medication will make you drowsy.  Do not drink alcohol or drive on this medication.  Heat to the low back and rest.  Please follow-up with your PCP if your symptoms do not improve.  Please go to the ER for any worsening symptoms.  Hope you feel better soon!
